# Patient Record
Sex: Male | Born: 2005 | Race: White | Hispanic: Yes | Marital: Single | Smoking: Never smoker
Health system: Southern US, Community
[De-identification: ages and names within clinical notes are randomized; demographics above are authoritative.]

---
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
## 2024-03-07 ENCOUNTER — Ambulatory Visit: Admitting: Pediatrics

## 2024-03-07 ENCOUNTER — Encounter: Payer: Self-pay | Admitting: Pediatrics

## 2024-03-07 VITALS — BP 110/74 | Ht 65.35 in | Wt 155.8 lb

## 2024-03-07 DIAGNOSIS — Z1331 Encounter for screening for depression: Secondary | ICD-10-CM | POA: Diagnosis not present

## 2024-03-07 DIAGNOSIS — Z68.41 Body mass index (BMI) pediatric, 5th percentile to less than 85th percentile for age: Secondary | ICD-10-CM

## 2024-03-07 DIAGNOSIS — Z0000 Encounter for general adult medical examination without abnormal findings: Secondary | ICD-10-CM

## 2024-03-07 DIAGNOSIS — Z1339 Encounter for screening examination for other mental health and behavioral disorders: Secondary | ICD-10-CM | POA: Diagnosis not present

## 2024-03-07 NOTE — Progress Notes (Unsigned)
 Adolescent Well Care Visit Derek Stanton is a 18 y.o. male who is here for well care.    PCP:  Stryffeler, Leita Norris, NP   History was provided by the patient.  Confidentiality was discussed with the patient and, if applicable, with caregiver as well. Patient's personal or confidential phone number: [phone] pt's cell   Current Issues: Current concerns include none.   Nutrition: Nutrition/Eating Behaviors: Regular diet- fruits, veggies Adequate calcium in diet?: cheese, yogurt Supplements/ Vitamins: none  Exercise/ Media: Play any Sports?/ Exercise: workout 5d/wk Screen Time:  no concerns Media Rules or Monitoring?: no concerns  Sleep:  Sleep: sleeps well, no concerns  Social Screening: Lives with:  parents, siblings Parental relations:  good Activities, Work, and Regulatory affairs officer?: works Holiday representative- part-time, helps around the house Concerns regarding behavior with peers?  no Stressors of note: no  Education: School Name: [employer]  Major: Manufacturing engineer- working on Physicist, medical: doing well; no concerns School Behavior: doing well; no concerns  Menstruation:   No LMP for male patient. Menstrual History: n/a   Confidential Social History: Tobacco?  no Secondhand smoke exposure?  no Drugs/ETOH?  no  Sexually Active?  Yes, females only Pregnancy Prevention: uses protection most of the time.  Safe at home, in school & in relationships?  Yes Safe to self?  Yes   Screenings: Patient has a dental home: yes  The patient completed the Rapid Assessment for Adolescent Preventive Services screening questionnaire and the following topics were identified as risk factors and discussed: condom use  In addition, the following topics were discussed as part of anticipatory guidance condom use.  PHQ-9 completed and results indicated 0  Physical Exam:  Vitals:   03/07/24 1032  Weight: 155 lb 12.8 oz (70.7 kg)  Height: 5' 5.35 (1.66  m)   Ht 5' 5.35 (1.66 m)   Wt 155 lb 12.8 oz (70.7 kg)   BMI 25.65 kg/m  Body mass index: body mass index is 25.65 kg/m. Blood pressure %iles are not available for patients who are 18 years or older.  Hearing Screening   500Hz  1000Hz  2000Hz  4000Hz   Right ear 20 20 20 20   Left ear 20 20 20 20    Vision Screening   Right eye Left eye Both eyes  Without correction 20/16 20/16 20/16   With correction       General Appearance:   alert, oriented, no acute distress and well nourished  HENT: Normocephalic, no obvious abnormality, conjunctiva clear  Mouth:   Normal appearing teeth, no obvious discoloration, dental caries, or dental caps  Neck:   Supple; thyroid: no enlargement, symmetric, no tenderness/mass/nodules  Chest WNL  Lungs:   Clear to auscultation bilaterally, normal work of breathing  Heart:   Regular rate and rhythm, S1 and S2 normal, no murmurs;   Abdomen:   Soft, non-tender, no mass, or organomegaly  GU genitalia not examined  Musculoskeletal:   Tone and strength strong and symmetrical, all extremities               Lymphatic:   No cervical adenopathy  Skin/Hair/Nails:   Skin warm, dry and intact, no rashes, no bruises or petechiae  Neurologic:   Strength, gait, and coordination normal and age-appropriate     Assessment and Plan:   17yo here for well adolescent exam  BMI is appropriate for age  Hearing screening result:normal Vision screening result: normal  Counseling provided for all of the vaccine components No orders of the defined types were  placed in this encounter.    Return in 1 year (on 03/07/2025)..  Keyetta Hollingworth R Sumiya Mamaril, MD
